# Patient Record
Sex: Male | Born: 1965
Health system: Southern US, Community
[De-identification: ages and names within clinical notes are randomized; demographics above are authoritative.]

---

## 2016-02-02 ENCOUNTER — Other Ambulatory Visit (HOSPITAL_COMMUNITY): Payer: Self-pay | Admitting: Family

## 2016-02-02 DIAGNOSIS — M7989 Other specified soft tissue disorders: Secondary | ICD-10-CM

## 2016-02-09 ENCOUNTER — Ambulatory Visit (HOSPITAL_COMMUNITY): Payer: BLUE CROSS/BLUE SHIELD

## 2016-02-17 ENCOUNTER — Ambulatory Visit (HOSPITAL_COMMUNITY)
Admission: RE | Admit: 2016-02-17 | Discharge: 2016-02-17 | Disposition: A | Discharge status: Home / Self Care | Payer: BLUE CROSS/BLUE SHIELD | Source: Ambulatory Visit | Attending: Family | Admitting: Family

## 2016-02-17 DIAGNOSIS — M799 Soft tissue disorder, unspecified: Secondary | ICD-10-CM | POA: Insufficient documentation

## 2016-02-17 DIAGNOSIS — M7989 Other specified soft tissue disorders: Secondary | ICD-10-CM

## 2017-06-27 IMAGING — US US EXTREM LOW*R* LIMITED
1 series · 13 of 13 positions shown · non-contrast
Comparison: None.

CLINICAL DATA: Painless posterior right knee mass for 1 month.

EXAM:
ULTRASOUND RIGHT LOWER EXTREMITY LIMITED
TECHNIQUE: Ultrasound examination of the lower extremity soft tissues was
performed in the area of clinical concern.

[Series 1: us extrem low*right* limited · 0.06mm/px · 13 acquisitions, 13 frames shown]
[im 1/13]
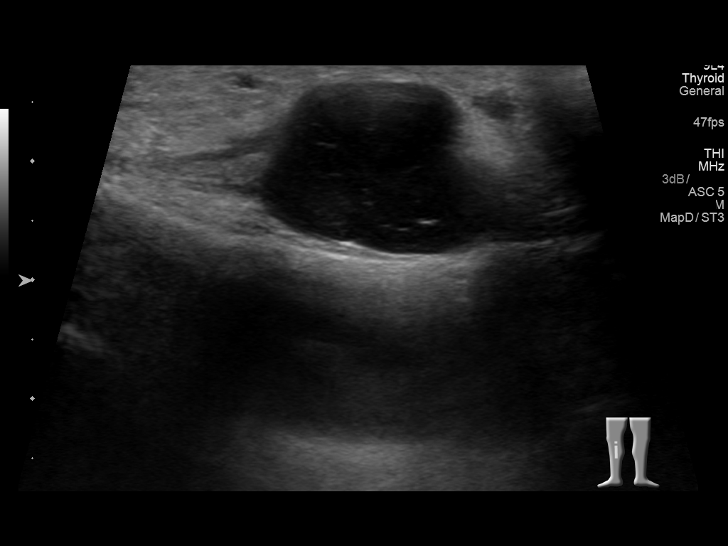
[im 2/13]
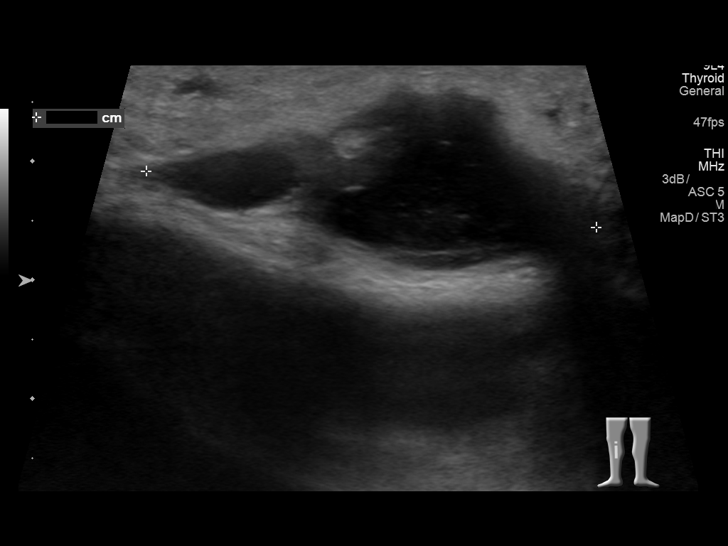
[im 3/13]
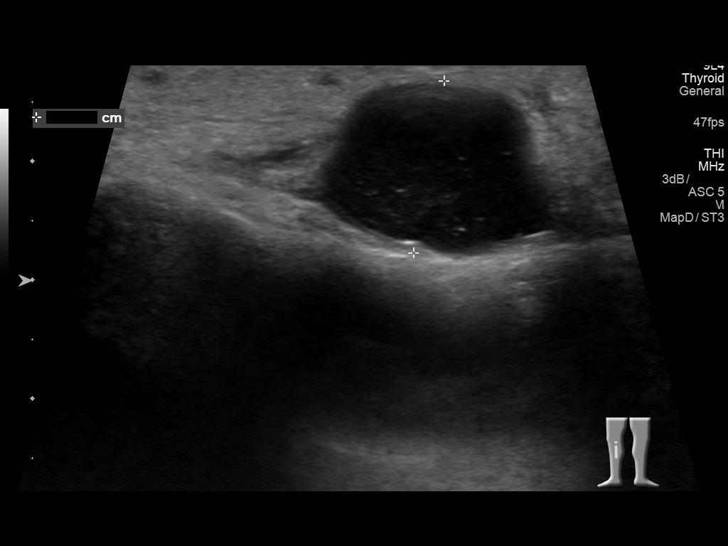
[im 4/13]
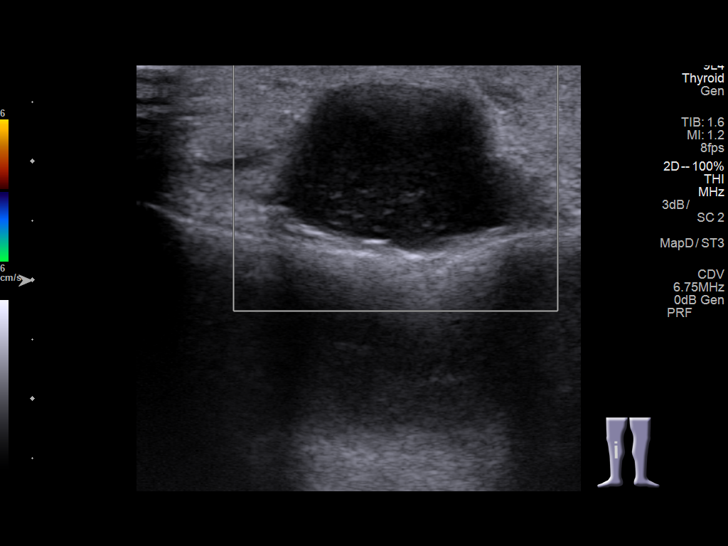
[im 5/13]
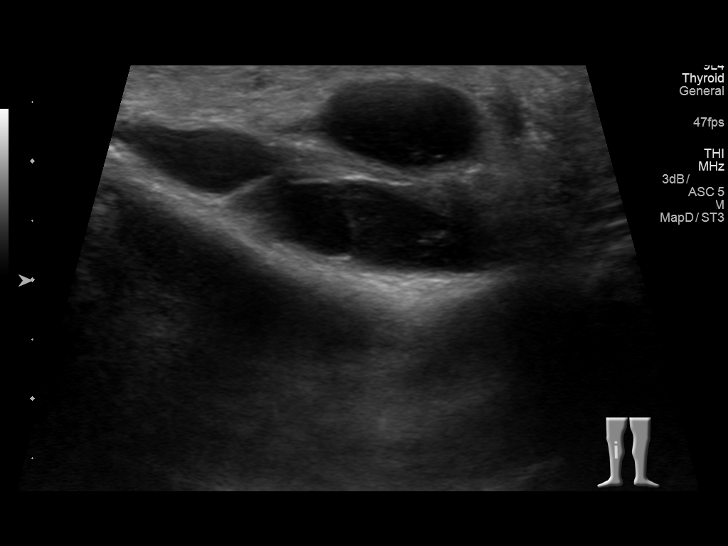
[im 6/13]
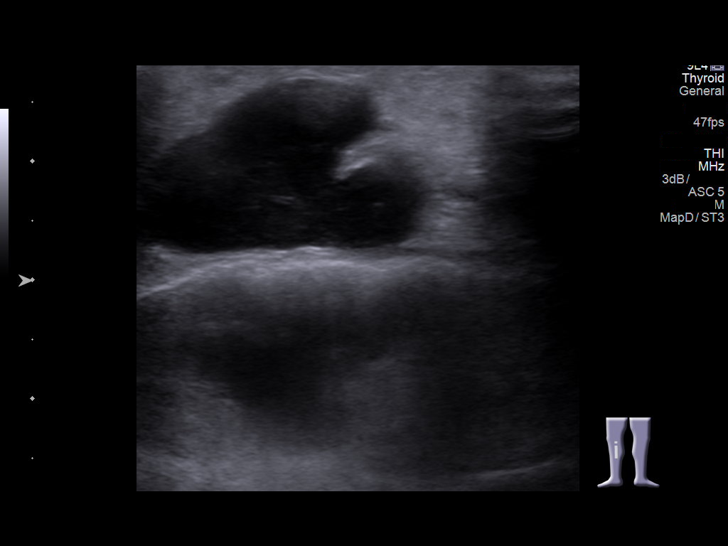
[im 7/13]
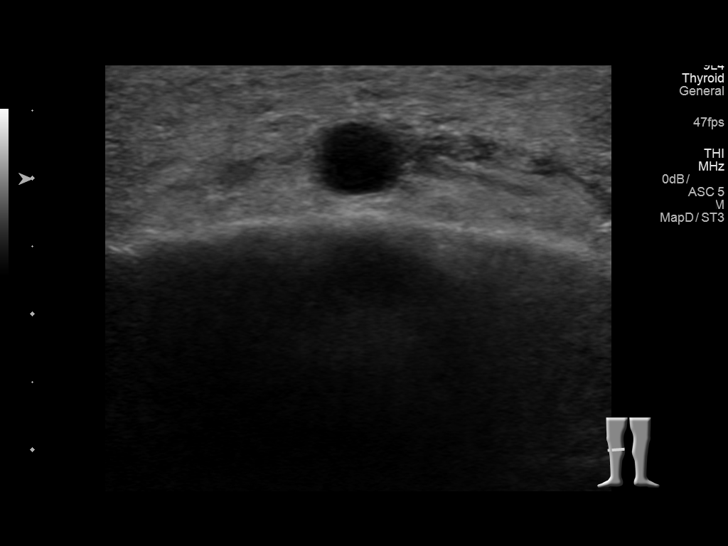
[im 8/13]
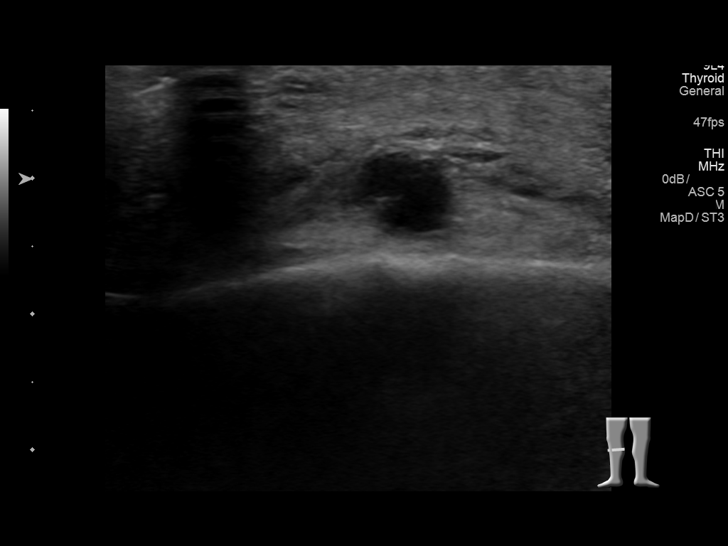
[im 9/13]
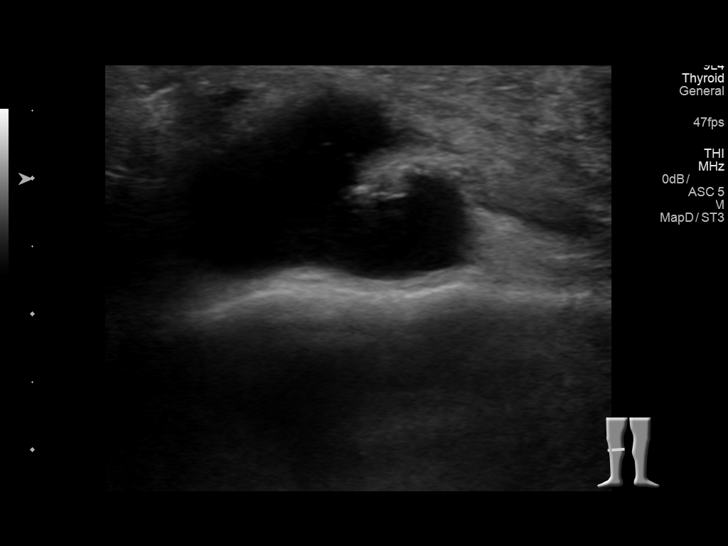
[im 10/13]
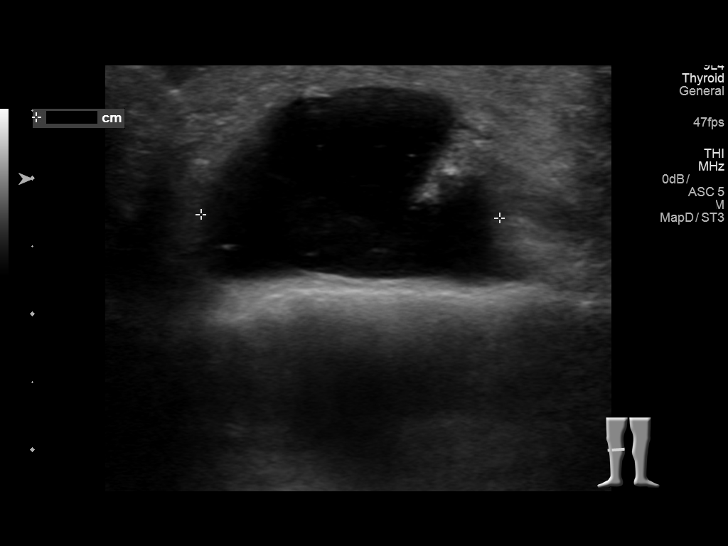
[im 11/13]
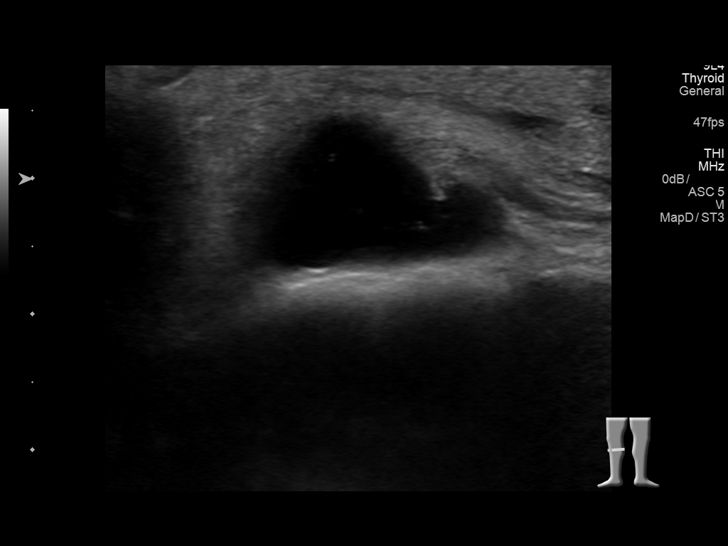
[im 12/13]
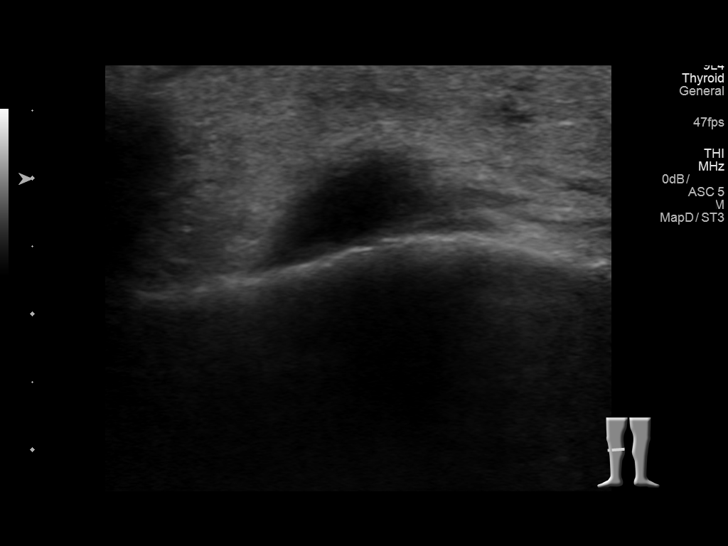
[im 13/13]
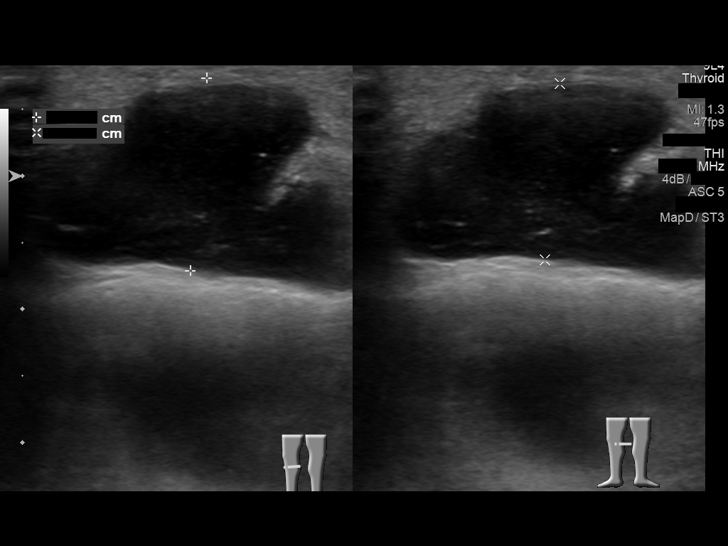

[13 of 13 positions shown; findings below may reference images not displayed]

FINDINGS: Posteromedially in the proximal calf, there is a complex fluid
collection which measures 3.8 x 1.5 x 2.2 cm. No solid components or
internal vascularity demonstrated with color Doppler.
IMPRESSION: Complex fluid collection posteromedially in the right calf, most
likely a complicated Baker's cyst based on location. Clinical follow
up recommended. If persistent or enlarging, this could be further
evaluated with MRI.

## 2018-12-04 ENCOUNTER — Other Ambulatory Visit: Payer: Self-pay | Admitting: Podiatry

## 2019-01-14 ENCOUNTER — Other Ambulatory Visit: Payer: Self-pay | Admitting: Critical Care Medicine

## 2019-01-14 ENCOUNTER — Encounter: Payer: Self-pay | Admitting: Critical Care Medicine

## 2019-01-15 NOTE — Progress Notes (Signed)
This is a 53 year old male who is in the [facility] awaiting housing placement.  He is in need of refills on his condom catheter and urinary bag.  He has urinary incontinence and has a pending appointment with urology.  [facility] of prescriptions this past week however the pharmacy that they were sent to does not supply these  The patient is asking for assistance in getting the supplies  We will rewrite the prescription for his urinary bag and condom catheters and attempt to obtain this from a medical supply company in the area  The patient states he has the dollars needed to purchase these supplies

## 2019-06-15 ENCOUNTER — Other Ambulatory Visit: Payer: BLUE CROSS/BLUE SHIELD

## 2019-06-15 ENCOUNTER — Other Ambulatory Visit: Payer: Self-pay

## 2019-06-15 DIAGNOSIS — Z20822 Contact with and (suspected) exposure to covid-19: Secondary | ICD-10-CM

## 2019-06-17 LAB — NOVEL CORONAVIRUS, NAA: SARS-CoV-2, NAA: NOT DETECTED

## 2019-11-30 ENCOUNTER — Other Ambulatory Visit: Payer: Self-pay

## 2019-11-30 ENCOUNTER — Ambulatory Visit: Payer: Self-pay | Attending: Internal Medicine

## 2019-11-30 DIAGNOSIS — Z20822 Contact with and (suspected) exposure to covid-19: Secondary | ICD-10-CM | POA: Insufficient documentation

## 2019-12-02 LAB — NOVEL CORONAVIRUS, NAA: SARS-CoV-2, NAA: NOT DETECTED

## 2020-03-31 ENCOUNTER — Ambulatory Visit: Payer: Self-pay | Admitting: Physician Assistant

## 2020-03-31 ENCOUNTER — Encounter: Payer: Self-pay | Admitting: Physician Assistant

## 2020-03-31 VITALS — BP 185/124 | HR 124 | Temp 95.7°F | Ht 74.0 in | Wt 202.6 lb

## 2020-03-31 DIAGNOSIS — F17200 Nicotine dependence, unspecified, uncomplicated: Secondary | ICD-10-CM

## 2020-03-31 DIAGNOSIS — Z7689 Persons encountering health services in other specified circumstances: Secondary | ICD-10-CM

## 2020-03-31 DIAGNOSIS — F1090 Alcohol use, unspecified, uncomplicated: Secondary | ICD-10-CM

## 2020-03-31 DIAGNOSIS — I10 Essential (primary) hypertension: Secondary | ICD-10-CM

## 2020-03-31 DIAGNOSIS — R000 Tachycardia, unspecified: Secondary | ICD-10-CM

## 2020-03-31 DIAGNOSIS — R2241 Localized swelling, mass and lump, right lower limb: Secondary | ICD-10-CM

## 2020-03-31 MED ORDER — METOPROLOL TARTRATE 50 MG PO TABS: 50.0000 mg | ORAL_TABLET | Freq: Two times a day (BID) | ORAL | 1 refills | Status: AC

## 2020-03-31 NOTE — Progress Notes (Signed)
BP (!) 185/124   Pulse (!) 124   Temp (!) 95.7 F (35.4 C)   Ht 6\' 2"  (1.88 m)   Wt 202 lb 9.6 oz (91.9 kg)   SpO2 98%   BMI 26.01 kg/m    Subjective:    Patient ID: Martin Benjamin, male    DOB: May 09, 1966, 54 y.o.   MRN: [id number]  HPI: Martin Benjamin is a 54 y.o. male presenting on 03/31/2020 for No chief complaint on file.   HPI    Pt had a negative covid 19 screening questionnaire.   Pt is a 54yoM who presents to office today to establish care.  Pt has not been getting medical care for a long time.  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   He denies HA, chest pain, SOB, recent vision changes.    He got both covid vaccination shots  He Never had colonoscopy  Pt reports a Bump on leg 2-3 years- he thinks it's same size.  No injury prior to onset of mass.  Pt says he had it looked at in the past but only imaging in Epic is 04/02/2020 done for a RLE mass on the posteromedial region of the calf which is a very different location than current mass.     Relevant past medical, surgical, family and social history reviewed and updated as indicated. Interim medical history since our last visit reviewed. Allergies and medications reviewed and updated.  No current outpatient medications on file.     Review of Systems  Per HPI unless specifically indicated above     Objective:    BP (!) 185/124   Pulse (!) 124   Temp (!) 95.7 F (35.4 C)   Ht 6\' 2"  (1.88 m)   Wt 202 lb 9.6 oz (91.9 kg)   SpO2 98%   BMI 26.01 kg/m   Wt Readings from Last 3 Encounters:  03/31/20 202 lb 9.6 oz (91.9 kg)    Physical Exam Vitals reviewed.  Constitutional:      General: He is not in acute distress.    Appearance: Normal appearance. He is well-developed. He is not ill-appearing.  HENT:     Head: Normocephalic and atraumatic.  Eyes:     Conjunctiva/sclera: Conjunctivae  normal.     Pupils: Pupils are equal, round, and reactive to light.  Neck:     Thyroid: No thyromegaly.  Cardiovascular:     Rate and Rhythm: Regular rhythm. Tachycardia present.  Pulmonary:     Effort: Pulmonary effort is normal.     Breath sounds: Normal breath sounds. No wheezing or rales.  Abdominal:     General: Bowel sounds are normal.     Palpations: Abdomen is soft. There is no mass.     Tenderness: There is no abdominal tenderness.  Musculoskeletal:     Cervical back: Neck supple.     Right lower leg: No edema.     Left lower leg: No edema.       Legs:     Comments: approx 3cm firm mass RLE as pictured.  It is firm, non-mobile, non-tender, no redness, no fluctuance.   Lymphadenopathy:     Cervical: No cervical adenopathy.  Skin:    General: Skin is warm and dry.     Findings: No rash.  Neurological:  Mental Status: He is alert and oriented to person, place, and time.     Motor: No weakness or tremor.     Gait: Gait is intact.  Psychiatric:        Behavior: Behavior normal.        Thought Content: Thought content normal.              EKG- ST at 108 bmp.  No acute st-t changes.  No previous for comparison     Assessment & Plan:    Encounter Diagnoses  Name Primary?  . Encounter to establish care Yes  . Essential hypertension   . Tachycardia   . Mass of right lower leg   . Tobacco use disorder   . Heavy alcohol consumption      -baseline Labs tomorrow -metoprolol ; pt urged to get started on medication today and he agrees -Xray leg -pt is given CAFA/ application for financial assistance -encouraged pt to decrease etoh consumption -pt to follow up in office in 2 weeks.  He is to contact office sooner prn changes or new symptoms

## 2020-03-31 NOTE — Patient Instructions (Signed)
-  Blood work  (fasting/nothing to eat after midnight- water & meds okay) -Xray leg -Financial assistance application -BP medication- [facility]

## 2020-04-04 ENCOUNTER — Other Ambulatory Visit: Payer: Self-pay | Admitting: Physician Assistant

## 2020-04-04 DIAGNOSIS — R000 Tachycardia, unspecified: Secondary | ICD-10-CM

## 2020-04-04 DIAGNOSIS — I10 Essential (primary) hypertension: Secondary | ICD-10-CM

## 2020-04-04 DIAGNOSIS — Z13220 Encounter for screening for lipoid disorders: Secondary | ICD-10-CM

## 2020-04-04 DIAGNOSIS — R2241 Localized swelling, mass and lump, right lower limb: Secondary | ICD-10-CM

## 2020-04-04 DIAGNOSIS — Z125 Encounter for screening for malignant neoplasm of prostate: Secondary | ICD-10-CM

## 2020-04-04 DIAGNOSIS — Z131 Encounter for screening for diabetes mellitus: Secondary | ICD-10-CM

## 2020-04-14 ENCOUNTER — Ambulatory Visit: Payer: Self-pay | Admitting: Physician Assistant

## 2020-04-25 ENCOUNTER — Ambulatory Visit: Payer: Self-pay | Admitting: Physician Assistant
